# Patient Record
Sex: Male | Born: 1995 | Race: White | Hispanic: No | Marital: Single | Smoking: Former smoker
Health system: Southern US, Community
[De-identification: ages and names within clinical notes are randomized; demographics above are authoritative.]

## PROBLEM LIST (undated history)

## (undated) DIAGNOSIS — J45909 Unspecified asthma, uncomplicated: Secondary | ICD-10-CM

## (undated) DIAGNOSIS — F429 Obsessive-compulsive disorder, unspecified: Secondary | ICD-10-CM

## (undated) DIAGNOSIS — F909 Attention-deficit hyperactivity disorder, unspecified type: Secondary | ICD-10-CM

## (undated) DIAGNOSIS — R55 Syncope and collapse: Secondary | ICD-10-CM

## (undated) HISTORY — PX: APPENDECTOMY: SHX54

## (undated) HISTORY — PX: ABDOMINAL SURGERY: SHX537

---
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
## 2015-09-28 ENCOUNTER — Encounter (HOSPITAL_COMMUNITY): Payer: Self-pay | Admitting: Vascular Surgery

## 2015-09-28 ENCOUNTER — Emergency Department (HOSPITAL_COMMUNITY)
Admission: EM | Admit: 2015-09-28 | Discharge: 2015-09-28 | Disposition: A | Discharge status: Home / Self Care | Attending: Emergency Medicine | Admitting: Emergency Medicine

## 2015-09-28 DIAGNOSIS — Y998 Other external cause status: Secondary | ICD-10-CM | POA: Diagnosis not present

## 2015-09-28 DIAGNOSIS — Z9889 Other specified postprocedural states: Secondary | ICD-10-CM | POA: Insufficient documentation

## 2015-09-28 DIAGNOSIS — S3991XA Unspecified injury of abdomen, initial encounter: Secondary | ICD-10-CM | POA: Insufficient documentation

## 2015-09-28 DIAGNOSIS — Z8659 Personal history of other mental and behavioral disorders: Secondary | ICD-10-CM | POA: Diagnosis not present

## 2015-09-28 DIAGNOSIS — R1033 Periumbilical pain: Secondary | ICD-10-CM

## 2015-09-28 DIAGNOSIS — Y9389 Activity, other specified: Secondary | ICD-10-CM | POA: Diagnosis not present

## 2015-09-28 DIAGNOSIS — Z794 Long term (current) use of insulin: Secondary | ICD-10-CM | POA: Insufficient documentation

## 2015-09-28 DIAGNOSIS — Y9289 Other specified places as the place of occurrence of the external cause: Secondary | ICD-10-CM | POA: Insufficient documentation

## 2015-09-28 DIAGNOSIS — Z79899 Other long term (current) drug therapy: Secondary | ICD-10-CM | POA: Diagnosis not present

## 2015-09-28 DIAGNOSIS — J45909 Unspecified asthma, uncomplicated: Secondary | ICD-10-CM | POA: Insufficient documentation

## 2015-09-28 DIAGNOSIS — Z9049 Acquired absence of other specified parts of digestive tract: Secondary | ICD-10-CM | POA: Diagnosis not present

## 2015-09-28 DIAGNOSIS — F17210 Nicotine dependence, cigarettes, uncomplicated: Secondary | ICD-10-CM | POA: Diagnosis not present

## 2015-09-28 LAB — COMPREHENSIVE METABOLIC PANEL
ALBUMIN: 4.3 g/dL (ref 3.5–5.0)
ALT: 20 U/L (ref 17–63)
ANION GAP: 6 (ref 5–15)
AST: 26 U/L (ref 15–41)
Alkaline Phosphatase: 72 U/L (ref 38–126)
BILIRUBIN TOTAL: 0.8 mg/dL (ref 0.3–1.2)
BUN: 8 mg/dL (ref 6–20)
CO2: 28 mmol/L (ref 22–32)
Calcium: 9.6 mg/dL (ref 8.9–10.3)
Chloride: 106 mmol/L (ref 101–111)
Creatinine, Ser: 0.98 mg/dL (ref 0.61–1.24)
GFR calc Af Amer: >60 mL/min (ref >60)
GFR calc non Af Amer: >60 mL/min (ref >60)
GLUCOSE: 112 mg/dL — AB (ref 65–99)
POTASSIUM: 3.6 mmol/L (ref 3.5–5.1)
SODIUM: 140 mmol/L (ref 135–145)
TOTAL PROTEIN: 6.7 g/dL (ref 6.5–8.1)

## 2015-09-28 LAB — URINALYSIS, ROUTINE W REFLEX MICROSCOPIC
BILIRUBIN URINE: NEGATIVE
Glucose, UA: NEGATIVE mg/dL
Hgb urine dipstick: NEGATIVE
Ketones, ur: NEGATIVE mg/dL
Leukocytes, UA: NEGATIVE
NITRITE: NEGATIVE
PH: 7.5 (ref 5.0–8.0)
Protein, ur: NEGATIVE mg/dL
SPECIFIC GRAVITY, URINE: 1.022 (ref 1.005–1.030)

## 2015-09-28 LAB — CBC
HEMATOCRIT: 47.3 % (ref 39.0–52.0)
HEMOGLOBIN: 16.1 g/dL (ref 13.0–17.0)
MCH: 30.1 pg (ref 26.0–34.0)
MCHC: 34 g/dL (ref 30.0–36.0)
MCV: 88.4 fL (ref 78.0–100.0)
Platelets: 235 10*3/uL (ref 150–400)
RBC: 5.35 MIL/uL (ref 4.22–5.81)
RDW: 12.3 % (ref 11.5–15.5)
WBC: 5 10*3/uL (ref 4.0–10.5)

## 2015-09-28 MED ORDER — OXYCODONE HCL 5 MG PO TABS: 5.0000 mg | ORAL_TABLET | Freq: Four times a day (QID) | ORAL | Status: DC | PRN

## 2015-09-28 MED ORDER — OXYCODONE-ACETAMINOPHEN 5-325 MG PO TABS
1.0000 | ORAL_TABLET | Freq: Once | ORAL | Status: AC
  Administered 2015-09-28: 1 via ORAL
  Filled 2015-09-28: qty 1
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
## 2015-09-28 NOTE — ED Notes (Signed)
Pt is aware urine is needed, pt unable to urinate at this time.  

## 2015-09-28 NOTE — ED Notes (Signed)
PA at bedside.
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
## 2015-09-28 NOTE — Discharge Instructions (Signed)
Please read and follow all provided instructions.  Your diagnoses today include:  1. Periumbilical abdominal pain    Tests performed today include:  Blood counts and electrolytes  Blood tests to check liver and kidney function  Blood tests to check pancreas function  Urine test to look for infection and pregnancy (in women)  Vital signs. See below for your results today.   Medications prescribed:   Take any prescribed medications only as directed.  You can use Ibuprofen 400mg  combined with Tylenol 1000mg  for pain relief every 6 hours. Do not exceed 4g of Tylenol in one 24 hour period. Do not exceed 10 days of this treatment. Use narcotics if pain uncontrolled with the aforementioned regiment.   Home care instructions:   Follow any educational materials contained in this packet.  Follow-up instructions: Please follow-up with your primary care provider for further evaluation of your symptoms.    Return instructions:  SEEK IMMEDIATE MEDICAL ATTENTION IF:  The pain does not go away or becomes severe   A temperature above 101F develops   Repeated vomiting occurs (multiple episodes)   The pain becomes localized to portions of the abdomen. The right side could possibly be appendicitis. In an adult, the left lower portion of the abdomen could be colitis or diverticulitis.   Blood is being passed in stools or vomit (bright red or black tarry stools)   You develop chest pain, difficulty breathing, dizziness or fainting, or become confused, poorly responsive, or inconsolable (young children)  If you have any other emergent concerns regarding your health  Additional Information: Abdominal (belly) pain can be caused by many things. Your caregiver performed an examination and possibly ordered blood/urine tests and imaging (CT scan, x-rays, ultrasound). Many cases can be observed and treated at home after initial evaluation in the emergency department. Even though you are being  discharged home, abdominal pain can be unpredictable. Therefore, you need a repeated exam if your pain does not resolve, returns, or worsens. Most patients with abdominal pain don't have to be admitted to the hospital or have surgery, but serious problems like appendicitis and gallbladder attacks can start out as nonspecific pain. Many abdominal conditions cannot be diagnosed in one visit, so follow-up evaluations are very important.  Your vital signs today were: BP 135/64 mmHg   Pulse 65   Temp(Src) 98.4 F (36.9 C) (Oral)   Resp 20   SpO2 98% If your blood pressure (bp) was elevated above 135/85 this visit, please have this repeated by your doctor within one month. --------------

## 2015-12-09 IMAGING — CR DG CHEST 2V
2 series · 2 of 2 positions shown · non-contrast
Comparison: None.

CLINICAL DATA: Near syncope. Acute onset of left-sided chest pain.
Current history of moderate asthma. Initial encounter.

EXAM:
CHEST  2 VIEW

[w chest pa]
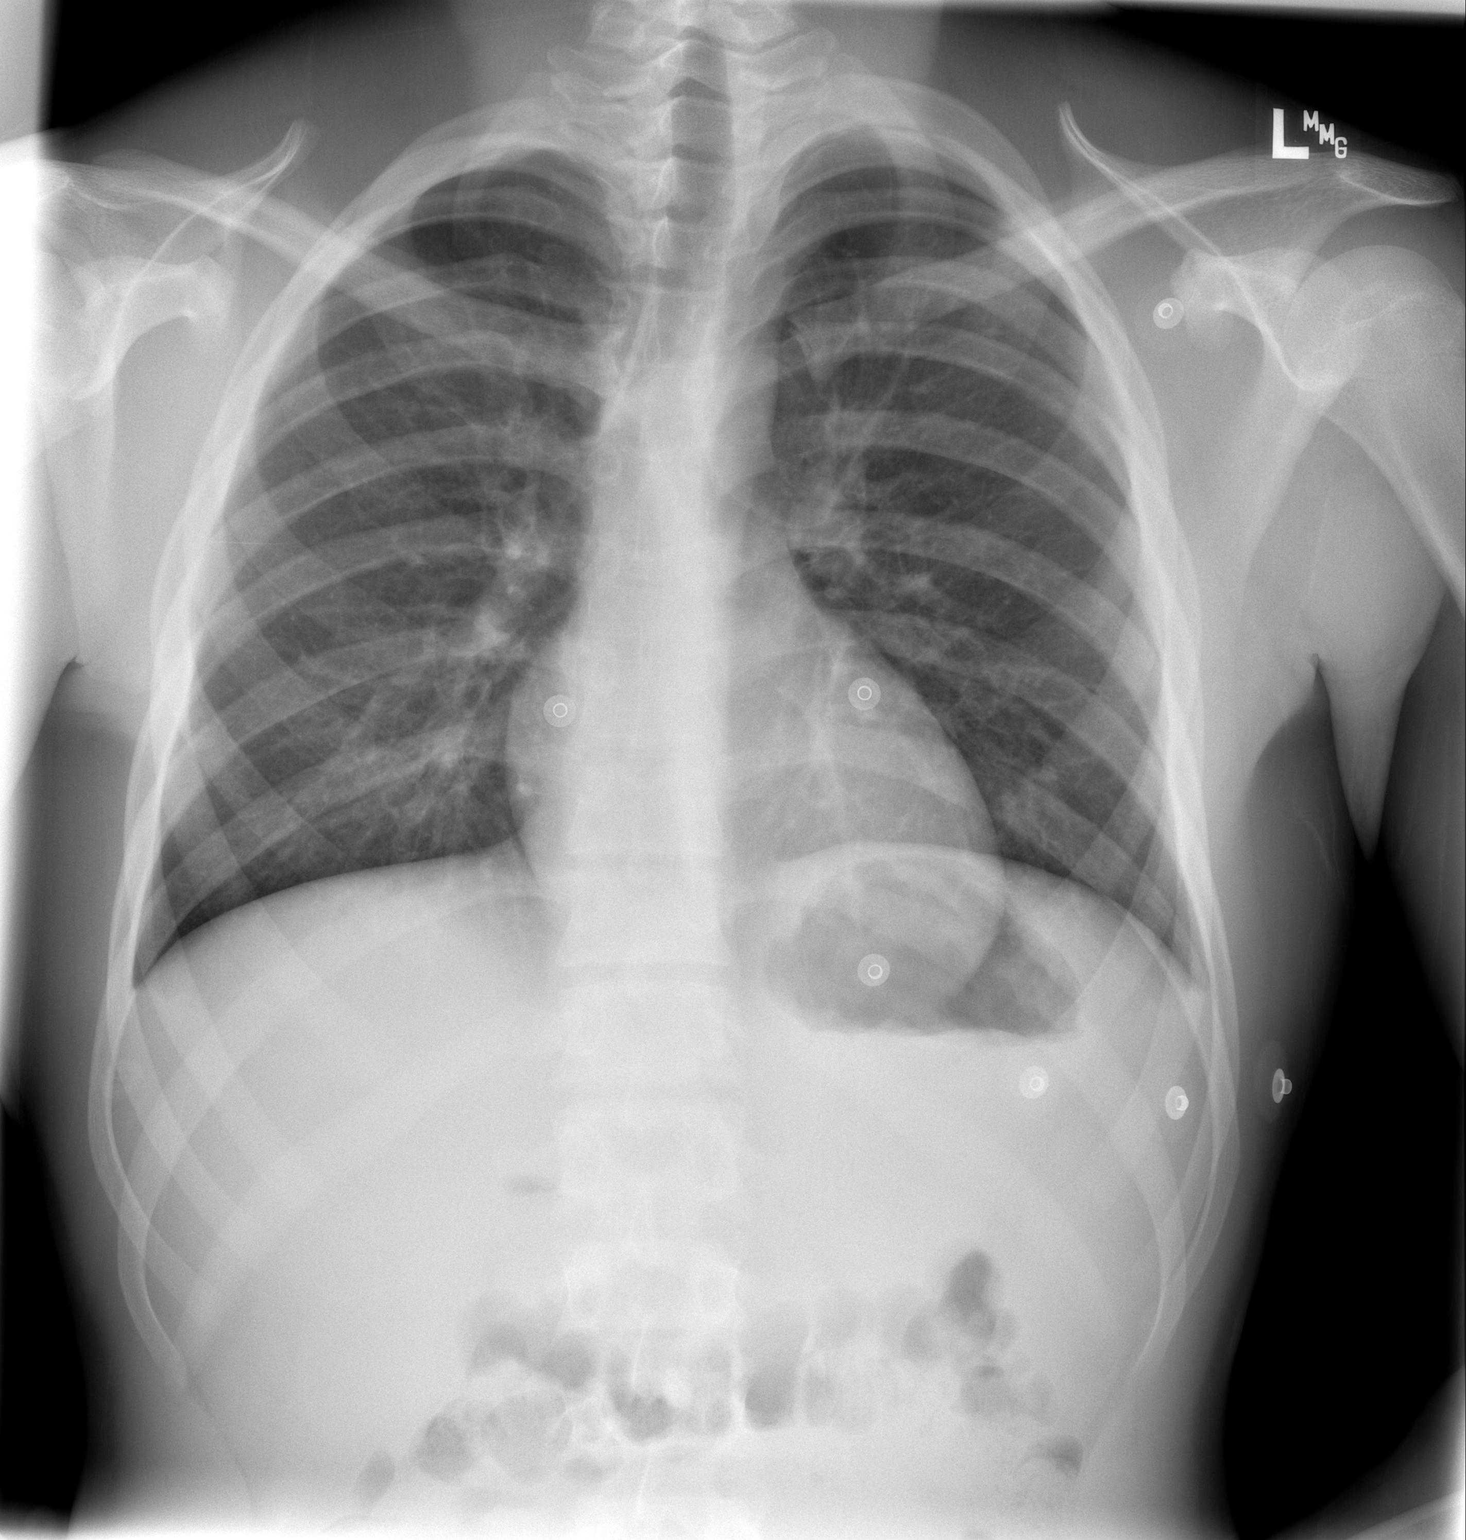

[w chest lat]
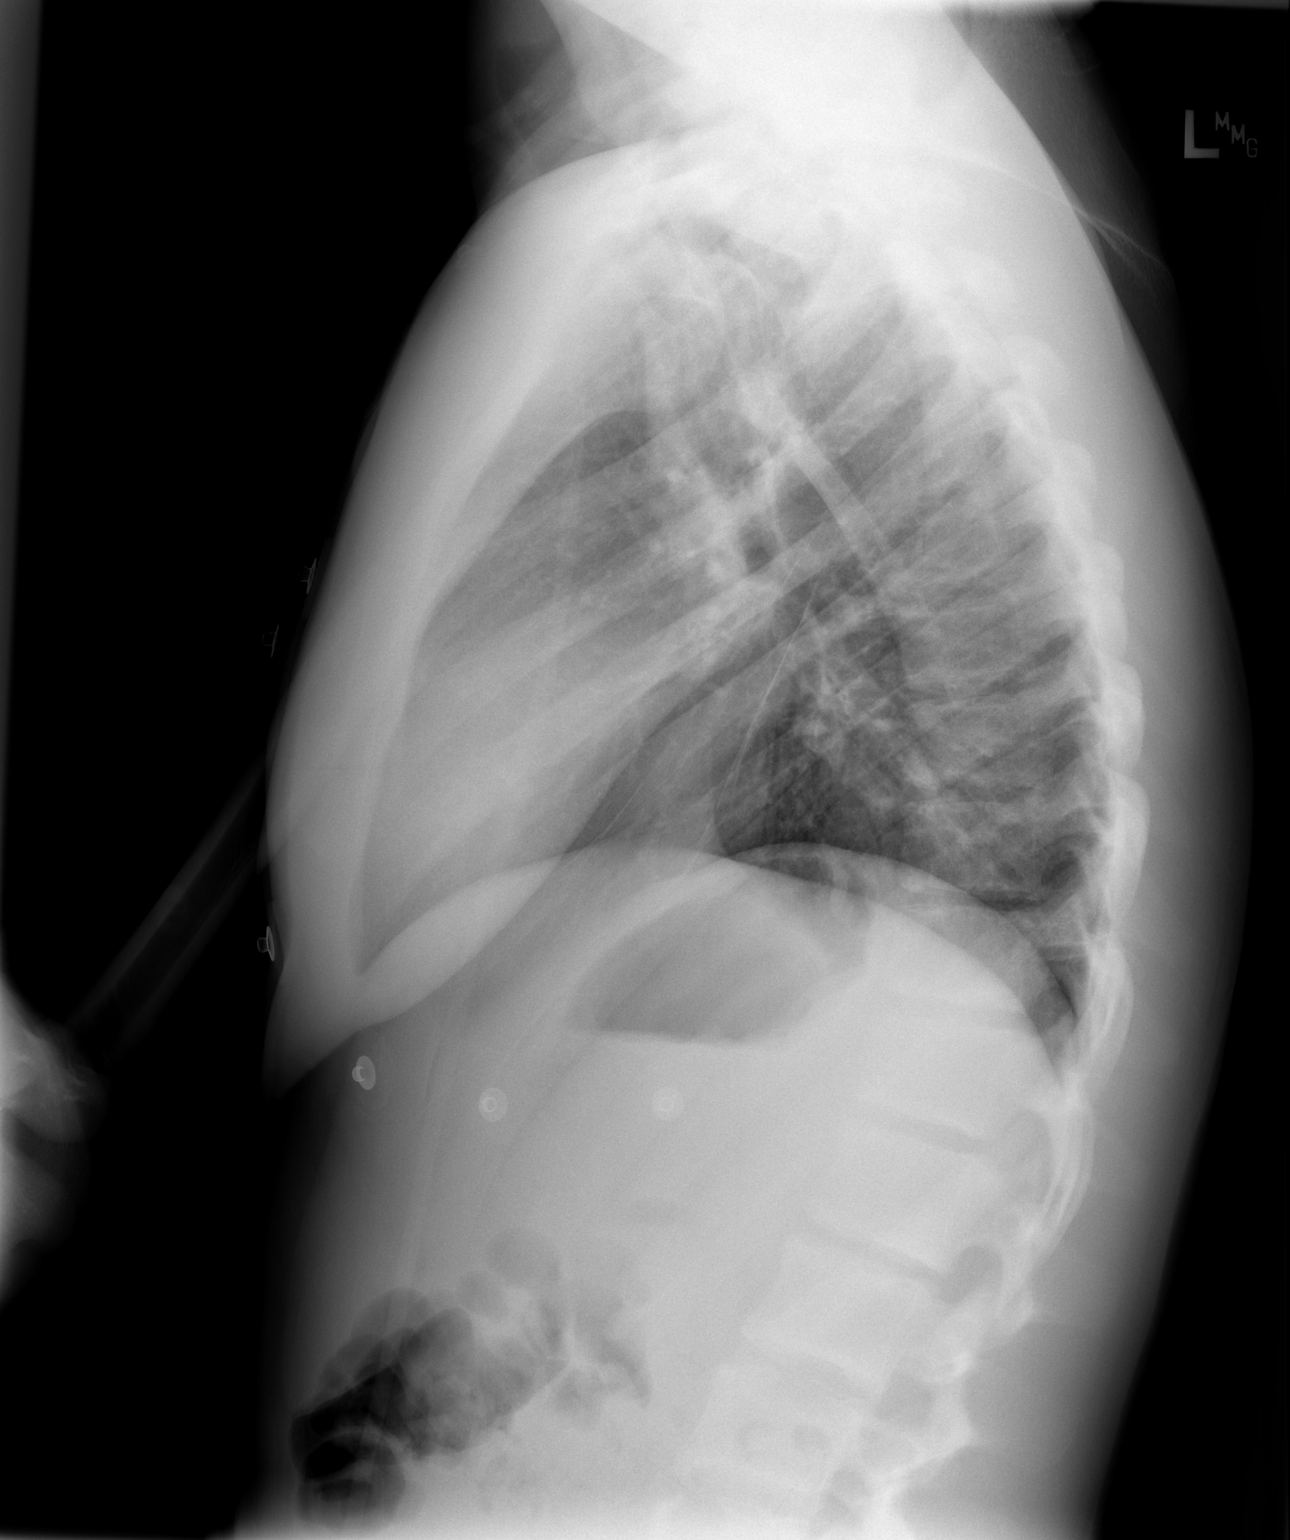

[2 of 2 positions shown; findings below may reference images not displayed]

FINDINGS: The lungs are well-aerated. There is no evidence of focal
opacification, pleural effusion or pneumothorax. Minimal right
basilar density may simply reflect normal vasculature, given the
lack of pulmonary symptoms.

The heart is normal in size; the mediastinal contour is within
normal limits. No acute osseous abnormalities are seen.
IMPRESSION: No acute cardiopulmonary process seen.

## 2018-09-16 ENCOUNTER — Encounter: Payer: Self-pay | Admitting: Emergency Medicine

## 2018-09-16 ENCOUNTER — Other Ambulatory Visit: Payer: Self-pay

## 2018-09-16 ENCOUNTER — Ambulatory Visit
Admission: EM | Admit: 2018-09-16 | Discharge: 2018-09-16 | Disposition: A | Discharge status: Home / Self Care | Payer: Managed Care, Other (non HMO) | Attending: Emergency Medicine | Admitting: Emergency Medicine

## 2018-09-16 DIAGNOSIS — H6122 Impacted cerumen, left ear: Secondary | ICD-10-CM

## 2018-09-16 DIAGNOSIS — H6692 Otitis media, unspecified, left ear: Secondary | ICD-10-CM

## 2018-09-16 MED ORDER — AMOXICILLIN 875 MG PO TABS: 875.0000 mg | ORAL_TABLET | Freq: Two times a day (BID) | ORAL | 0 refills | Status: AC

## 2018-09-16 NOTE — ED Provider Notes (Signed)
[facility] ____________________________________________  Time seen: Approximately 1:11 PM  I have reviewed the triage vital signs and the nursing notes.   HISTORY  Chief Complaint Otalgia (APPT)   HPI Douglas Hensley is a 23 y.o. male seen for evaluation of left ear discomfort and muffled sensation present since yesterday.  States his friend accidentally pushed a Q-tip in too far and he has had muffled hearing since.  Also left ear soreness.  Denies drainage.  Denies recent cough, congestion, sore throat, fevers or other complaints.  Continues to eat and drink well.  Denies other aggravating alleviating factors.  No recent antibiotic use.   Past Medical History:  Diagnosis Date  . ADHD (attention deficit hyperactivity disorder)   . Asthma   . OCD (obsessive compulsive disorder)   . Syncope     There are no active problems to display for this patient.   Past Surgical History:  Procedure Laterality Date  . ABDOMINAL SURGERY    . APPENDECTOMY       No current facility-administered medications for this encounter.   Current Outpatient Medications:  .  amoxicillin (AMOXIL) 875 MG tablet, Take 1 tablet (875 mg total) by mouth 2 (two) times daily., Disp: 20 tablet, Rfl: 0  Allergies Depakote [valproic acid] and Keflex [cephalexin]  History reviewed. No pertinent family history.  Social History Social History   Tobacco Use  . Smoking status: Former Smoker    Packs/day: 0.50    Types: Cigarettes  . Smokeless tobacco: Never Used  Substance Use Topics  . Alcohol use: No  . Drug use: No    Review of Systems Constitutional: No fever ENT: No sore throat.  Positive otalgia. Cardiovascular: Denies chest pain. Respiratory: Denies shortness of breath. Gastrointestinal: No abdominal pain.   Musculoskeletal: Negative for back pain. Skin: Negative for rash.   ____________________________________________   PHYSICAL EXAM:  VITAL SIGNS: ED Triage  Vitals  Enc Vitals Group     BP 09/16/18 1223 124/85     Pulse Rate 09/16/18 1223 85     Resp 09/16/18 1223 18     Temp 09/16/18 1223 98.4 F (36.9 C)     Temp Source 09/16/18 1223 Oral     SpO2 09/16/18 1223 100 %     Weight 09/16/18 1222 128 lb (58.1 kg)     Height 09/16/18 1222 5\' 9"  (1.753 m)     Head Circumference --      Peak Flow --      Pain Score 09/16/18 1222 6     Pain Loc --      Pain Edu? --      Excl. in GC? --     Constitutional: Alert and oriented. Well appearing and in no acute distress. Eyes: Conjunctivae are normal.  Head: Atraumatic. No sinus tenderness to palpation. No swelling. No erythema.  Ears: Left: Mild tenderness with auricle movement, canal with full cerumen impaction, post cerumen removal, canal clear, moderate erythema with dull TM, TM intact.  Right: Nontender, normal canal, no erythema, normal TM.  Nose: No nasal congestion.  Mouth/Throat: Mucous membranes are moist. No pharyngeal erythema. No tonsillar swelling or exudate.  Neck: No stridor.  No cervical spine tenderness to palpation. Hematological/Lymphatic/Immunilogical: No cervical lymphadenopathy. Cardiovascular: Normal rate, regular rhythm. Grossly normal heart sounds.  Good peripheral circulation. Respiratory: Normal respiratory effort.  No retractions. No wheezes, rales or rhonchi. Good air movement.  Musculoskeletal: Ambulatory with steady gait.  Neurologic:  Normal speech and language. No gait instability.  Skin:  Skin appears warm, dry and intact. No rash noted. Psychiatric: Mood and affect are normal. Speech and behavior are normal. ___________________________________________   LABS (all labs ordered are listed, but only abnormal results are displayed)  Labs Reviewed - No data to display ____________________________________________   PROCEDURES Procedures   Left ear cerumen total impaction.  Irrigated removed by [employer].  Patient tolerated well.  INITIAL IMPRESSION / ASSESSMENT  AND PLAN / ED COURSE  Pertinent labs & imaging results that were available during my care of the patient were reviewed by me and considered in my medical decision making (see chart for details).  Well-appearing patient.  No acute distress.  Left total cerumen impaction, removed.  Left otitis media.  Will treat with oral amoxicillin.  Supportive care.  Discussed follow up with Primary care physician this week. Discussed follow up and return parameters including no resolution or any worsening concerns. Patient verbalized understanding and agreed to plan.   ____________________________________________   FINAL CLINICAL IMPRESSION(S) / ED DIAGNOSES  Final diagnoses:  Impacted cerumen of left ear  Left otitis media, unspecified otitis media type     ED Discharge Orders         Ordered    amoxicillin (AMOXIL) 875 MG tablet  2 times daily     09/16/18 1319           Note: This dictation was prepared with Dragon dictation along with smaller phrase technology. Any transcriptional errors that result from this process are unintentional.         Renford DillsMiller, Galilea Quito, NP 09/16/18 1703

## 2018-09-16 NOTE — Discharge Instructions (Addendum)
Take medication as prescribed. Rest. Drink plenty of fluids.  ° °Follow up with your primary care physician this week as needed. Return to Urgent care for new or worsening concerns.  ° °

## 2018-09-16 NOTE — ED Triage Notes (Signed)
Patient states he used a q-tip yesterday and now has pain and fullness in his left ear.

## 2019-05-19 ENCOUNTER — Ambulatory Visit: Payer: Managed Care, Other (non HMO) | Attending: Internal Medicine

## 2019-05-19 DIAGNOSIS — Z20822 Contact with and (suspected) exposure to covid-19: Secondary | ICD-10-CM

## 2019-05-20 LAB — NOVEL CORONAVIRUS, NAA: SARS-CoV-2, NAA: NOT DETECTED

## 2021-08-10 ENCOUNTER — Ambulatory Visit: Payer: Self-pay | Admitting: General Surgery

## 2021-08-10 NOTE — H&P (View-Only) (Signed)
PATIENT PROFILE: ?Douglas Hensley is a 26 y.o. male who presents to the Clinic for consultation at the request of Whitaker, PA for evaluation of recurrent infection of the umbilical area. ? ?PCP:  Whitaker, Jason Hestle, PA ? ?HISTORY OF PRESENT ILLNESS: ?Douglas Hensley reports He has recurrent infection of the periumbilical area.  This infection happened about every month or 2.  He endorses that it happened mostly to the right side of the umbilical area.  He feels pain on the periumbilical area during episodes.  No pain radiation.  The area gets red and drains purulent output.  He also takes a bunch of hair from the area.  He denies episode of fever.  He has had multiple incision and drainage.  Currently he denies any flareup. ? ? ?PROBLEM LIST: ?Problem List  Date Reviewed: 03/29/2021  ? ?       Noted  ? Syncope 12/02/2012  ? Palpitations 02/26/2012  ? Tourette syndrome 01/22/2012  ? HTN (hypertension) 01/22/2012  ? ? ?GENERAL REVIEW OF SYSTEMS:  ? ?General ROS: negative for - chills, fatigue, fever, weight gain or weight loss ?Allergy and Immunology ROS: negative for - hives  ?Hematological and Lymphatic ROS: negative for - bleeding problems or bruising, negative for palpable nodes ?Endocrine ROS: negative for - heat or cold intolerance, hair changes ?Respiratory ROS: negative for - cough, shortness of breath or wheezing ?Cardiovascular ROS: no chest pain or palpitations ?GI ROS: negative for nausea, vomiting, abdominal pain, diarrhea, constipation ?Musculoskeletal ROS: negative for - joint swelling or muscle pain ?Neurological ROS: negative for - confusion, syncope ?Dermatological ROS: negative for pruritus and rash ?Psychiatric: negative for anxiety, depression, difficulty sleeping and memory loss ? ?MEDICATIONS: ?Current Outpatient Medications  ?Medication Sig Dispense Refill  ? diphenhydrAMINE-acetaminophen (TYLENOL PM EXTRA STRENGTH) 25-500 mg per tablet Take 1 tablet by mouth once daily    ? traZODone (DESYREL)  50 MG tablet Take 1 tablet (50 mg total) by mouth at bedtime 30 tablet 11  ? ?No current facility-administered medications for this visit.  ? ? ?ALLERGIES: ?Divalproex, Cephalexin, Penicillin, and Penicillins ? ?PAST MEDICAL HISTORY: ?Past Medical History:  ?Diagnosis Date  ? Allergy   ? Tourette syndrome   ? ? ?PAST SURGICAL HISTORY: ?Past Surgical History:  ?Procedure Laterality Date  ? ADENOIDECTOMY    ?  ? ?FAMILY HISTORY: ?Family History  ?Problem Relation Age of Onset  ? High blood pressure (Hypertension) Mother   ? Allergies Mother   ? Cancer Mother   ? Diabetes Mother   ? High blood pressure (Hypertension) Father   ? Allergies Father   ? Diabetes Father   ? Thyroid disease Father   ? Spina bifida Sister   ? Cancer Maternal Grandmother   ? Cancer Maternal Grandfather   ? Heart disease Paternal Grandmother   ? Hearing loss Paternal Grandmother   ? Thyroid disease Paternal Grandmother   ? Hearing loss Paternal Grandfather   ? Heart disease Paternal Grandfather   ?  ? ?SOCIAL HISTORY: ?Social History  ? ?Socioeconomic History  ? Marital status: Single  ?Tobacco Use  ? Smoking status: Former  ?  Packs/day: 2.00  ?  Years: 3.00  ?  Pack years: 6.00  ?  Types: Cigarettes  ? Smokeless tobacco: Current  ?  Types: Chew  ?Vaping Use  ? Vaping Use: Some days  ?Substance and Sexual Activity  ? Alcohol use: Yes  ?  Alcohol/week: 12.0 standard drinks  ?  Types: 12 Cans of beer per   week  ? Drug use: Yes  ?  Comment: marajuana  ? Sexual activity: Yes  ?  Partners: Female  ?Social History Narrative  ? Rising 11th grader at [employer]. Lives in [place] with parents and twin sister.  ? ? ?PHYSICAL EXAM: ?Vitals:  ? 08/10/21 0938  ?BP: 130/87  ?Pulse: 62  ? ?Body mass index is 24.51 kg/m?. ?Weight: 75.3 kg (166 lb)  ? ?GENERAL: Alert, active, oriented x3 ? ?HEENT: Pupils equal reactive to light. Extraocular movements are intact. Sclera clear. Palpebral conjunctiva normal red color.Pharynx clear. ? ?NECK: Supple with  no palpable mass and no adenopathy. ? ?LUNGS: Sound clear with no rales rhonchi or wheezes. ? ?HEART: Regular rhythm S1 and S2 without murmur. ? ?ABDOMEN: Soft and depressible, nontender with no palpable mass, no hepatomegaly. Wounds dry and clean.  Today periumbilical area with small scarred sinus without any deep fluid collection.  Area is hairy.  Infraumbilical scar ? ?EXTREMITIES: Well-developed well-nourished symmetrical with no dependent edema. ? ?NEUROLOGICAL: Awake alert oriented, facial expression symmetrical, moving all extremities. ? ?REVIEW OF DATA: ?I have reviewed the following data today: ?Initial consult on 07/27/2021  ?Component Date Value  ? WBC (White Blood Cell Co* 07/27/2021 8.8   ? RBC (Red Blood Cell Coun* 07/27/2021 5.09   ? Hemoglobin 07/27/2021 15.5   ? Hematocrit 07/27/2021 44.4   ? MCV (Mean Corpuscular Vo* 07/27/2021 87.2   ? MCH (Mean Corpuscular He* 07/27/2021 30.5   ? MCHC (Mean Corpuscular H* 07/27/2021 34.9   ? Platelet Count 07/27/2021 234   ? RDW-CV (Red Cell Distrib* 07/27/2021 12.0   ? MPV (Mean Platelet Volum* 07/27/2021 10.3   ? Neutrophils 07/27/2021 6.72   ? Lymphocytes 07/27/2021 1.16   ? Monocytes 07/27/2021 0.49   ? Eosinophils 07/27/2021 0.39   ? Basophils 07/27/2021 0.07   ? Neutrophil % 07/27/2021 76.1 (H)   ? Lymphocyte % 07/27/2021 13.1   ? Monocyte % 07/27/2021 5.5   ? Eosinophil % 07/27/2021 4.4   ? Basophil% 07/27/2021 0.8   ? Immature Granulocyte % 07/27/2021 0.1   ? Immature Granulocyte Cou* 07/27/2021 0.01   ? Glucose 07/27/2021 80   ? Sodium 07/27/2021 140   ? Potassium 07/27/2021 4.2   ? Chloride 07/27/2021 105   ? Carbon Dioxide (CO2) 07/27/2021 29.3   ? Urea Nitrogen (BUN) 07/27/2021 16   ? Creatinine 07/27/2021 0.9   ? Glomerular Filtration Ra* 07/27/2021 102   ? Calcium 07/27/2021 9.6   ? AST  07/27/2021 31   ? ALT  07/27/2021 20   ? Alk Phos (alkaline Phosp* 07/27/2021 77   ? Albumin 07/27/2021 4.6   ? Bilirubin, Total 07/27/2021 0.9   ? Protein, Total  07/27/2021 6.8   ? A/G Ratio 07/27/2021 2.1   ? Cholesterol, Total 07/27/2021 123   ? Triglyceride 07/27/2021 28 (L)   ? HDL (High Density Lipopr* 07/27/2021 43.4   ? LDL Calculated 07/27/2021 74   ? VLDL Cholesterol 07/27/2021 6   ? Cholesterol/HDL Ratio 07/27/2021 2.8   ? Thyroid Stimulating Horm* 07/27/2021 0.928   ? Color 07/27/2021 Yellow   ? Clarity 07/27/2021 Clear   ? Specific Gravity 07/27/2021 1.025   ? pH, Urine 07/27/2021 6.0   ? Protein, Urinalysis 07/27/2021 Negative   ? Glucose, Urinalysis 07/27/2021 Negative   ? Ketones, Urinalysis 07/27/2021 Negative   ? Blood, Urinalysis 07/27/2021 Negative   ? Nitrite, Urinalysis 07/27/2021 Negative   ? Leukocyte Esterase, Urin* 07/27/2021 Negative   ?   White Blood Cells, Urina* 07/27/2021 None Seen   ? Red Blood Cells, Urinaly* 07/27/2021 None Seen   ? Bacteria, Urinalysis 07/27/2021 None Seen   ? Squamous Epithelial Cell* 07/27/2021 None Seen   ?  ? ?ASSESSMENT: ?Douglas Hensley is a 26 y.o. male presenting for consultation for periumbilical infections. ? ?The patient endorses that he has recurrent infections every month or 2.  Infections are on the paramedical area mostly on the right side.  He endorses that the content of the drainage is purulent and a bunch of hair.  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  We discussed about surgical excision of the pilonidal disease.  We discussed about possible excision of the umbilical stalk.  We discussed about risk of surgery drain includes pain, infection, bleeding, recurrence, scarring, hernia, risk for anesthesia among others.  The patient report he understood and agreed to proceed. ? ?Pilonidal cyst without abscess [L05.91] ? ?PLAN: ?1.  Excision of the umbilical pilonidal cyst (11771) ?2.  Avoid taking aspirin 5 days before the procedure ?3.  CBC and CMP done ?4.  Contact us if you have any concern ? ?Patient verbalized understanding, all questions were answered, and  were agreeable with the plan outlined above.  ? ? ? ?Edgardo Cintron-Diaz, MD ? ?Electronically signed by Edgardo Cintron-Diaz, MD ? ?

## 2021-08-10 NOTE — H&P (Signed)
PATIENT PROFILE: ?Douglas Hensley is a 26 y.o. male who presents to the Clinic for consultation at the request of Douglas Hensley, Douglas Hensley for evaluation of recurrent infection of the umbilical area. ? ?PCP:  Douglas Rossetti, PA ? ?HISTORY OF PRESENT ILLNESS: ?Douglas Hensley reports He has recurrent infection of the periumbilical area.  This infection happened about every month or 2.  He endorses that it happened mostly to the right side of the umbilical area.  He feels pain on the periumbilical area during episodes.  No pain radiation.  The area gets red and drains purulent output.  He also takes a bunch of hair from the area.  He denies episode of fever.  He has had multiple incision and drainage.  Currently he denies any flareup. ? ? ?PROBLEM LIST: ?Problem List  Date Reviewed: 03/29/2021  ? ?       Noted  ? Syncope 12/02/2012  ? Palpitations 02/26/2012  ? Tourette syndrome 01/22/2012  ? HTN (hypertension) 01/22/2012  ? ? ?GENERAL REVIEW OF SYSTEMS:  ? ?General ROS: negative for - chills, fatigue, fever, weight gain or weight loss ?Allergy and Immunology ROS: negative for - hives  ?Hematological and Lymphatic ROS: negative for - bleeding problems or bruising, negative for palpable nodes ?Endocrine ROS: negative for - heat or cold intolerance, hair changes ?Respiratory ROS: negative for - cough, shortness of breath or wheezing ?Cardiovascular ROS: no chest pain or palpitations ?GI ROS: negative for nausea, vomiting, abdominal pain, diarrhea, constipation ?Musculoskeletal ROS: negative for - joint swelling or muscle pain ?Neurological ROS: negative for - confusion, syncope ?Dermatological ROS: negative for pruritus and rash ?Psychiatric: negative for anxiety, depression, difficulty sleeping and memory loss ? ?MEDICATIONS: ?Current Outpatient Medications  ?Medication Sig Dispense Refill  ? diphenhydrAMINE-acetaminophen (TYLENOL PM EXTRA STRENGTH) 25-500 mg per tablet Take 1 tablet by mouth once daily    ? traZODone (DESYREL)  50 MG tablet Take 1 tablet (50 mg total) by mouth at bedtime 30 tablet 11  ? ?No current facility-administered medications for this visit.  ? ? ?ALLERGIES: ?Divalproex, Cephalexin, Penicillin, and Penicillins ? ?PAST MEDICAL HISTORY: ?Past Medical History:  ?Diagnosis Date  ? Allergy   ? Tourette syndrome   ? ? ?PAST SURGICAL HISTORY: ?Past Surgical History:  ?Procedure Laterality Date  ? ADENOIDECTOMY    ?  ? ?FAMILY HISTORY: ?Family History  ?Problem Relation Age of Onset  ? High blood pressure (Hypertension) Mother   ? Allergies Mother   ? Cancer Mother   ? Diabetes Mother   ? High blood pressure (Hypertension) Father   ? Allergies Father   ? Diabetes Father   ? Thyroid disease Father   ? Spina bifida Sister   ? Cancer Maternal Grandmother   ? Cancer Maternal Grandfather   ? Heart disease Paternal Grandmother   ? Hearing loss Paternal Grandmother   ? Thyroid disease Paternal Grandmother   ? Hearing loss Paternal Grandfather   ? Heart disease Paternal Grandfather   ?  ? ?SOCIAL HISTORY: ?Social History  ? ?Socioeconomic History  ? Marital status: Single  ?Tobacco Use  ? Smoking status: Former  ?  Packs/day: 2.00  ?  Years: 3.00  ?  Pack years: 6.00  ?  Types: Cigarettes  ? Smokeless tobacco: Current  ?  Types: Chew  ?Vaping Use  ? Vaping Use: Some days  ?Substance and Sexual Activity  ? Alcohol use: Yes  ?  Alcohol/week: 12.0 standard drinks  ?  Types: 12 Cans of beer per  week  ? Drug use: Yes  ?  Comment: marajuana  ? Sexual activity: Yes  ?  Partners: Female  ?Social History Narrative  ? Rising 11th grader at [employer]. Lives in [place] with parents and twin sister.  ? ? ?PHYSICAL EXAM: ?Vitals:  ? 08/10/21 0938  ?BP: 130/87  ?Pulse: 62  ? ?Body mass index is 24.51 kg/m?. ?Weight: 75.3 kg (166 lb)  ? ?GENERAL: Alert, active, oriented x3 ? ?HEENT: Pupils equal reactive to light. Extraocular movements are intact. Sclera clear. Palpebral conjunctiva normal red color.Pharynx clear. ? ?NECK: Supple with  no palpable mass and no adenopathy. ? ?LUNGS: Sound clear with no rales rhonchi or wheezes. ? ?HEART: Regular rhythm S1 and S2 without murmur. ? ?ABDOMEN: Soft and depressible, nontender with no palpable mass, no hepatomegaly. Wounds dry and clean.  Today periumbilical area with small scarred sinus without any deep fluid collection.  Area is hairy.  Infraumbilical scar ? ?EXTREMITIES: Well-developed well-nourished symmetrical with no dependent edema. ? ?NEUROLOGICAL: Awake alert oriented, facial expression symmetrical, moving all extremities. ? ?REVIEW OF DATA: ?I have reviewed the following data today: ?Initial consult on 07/27/2021  ?Component Date Value  ? WBC (White Blood Cell Co* 07/27/2021 8.8   ? RBC (Red Blood Cell Coun* 07/27/2021 5.09   ? Hemoglobin 07/27/2021 15.5   ? Hematocrit 07/27/2021 44.4   ? MCV (Mean Corpuscular Vo* 07/27/2021 87.2   ? MCH (Mean Corpuscular He* 07/27/2021 30.5   ? MCHC (Mean Corpuscular H* 07/27/2021 34.9   ? Platelet Count 07/27/2021 234   ? RDW-CV (Red Cell Distrib* 07/27/2021 12.0   ? MPV (Mean Platelet Volum* 07/27/2021 10.3   ? Neutrophils 07/27/2021 6.72   ? Lymphocytes 07/27/2021 1.16   ? Monocytes 07/27/2021 0.49   ? Eosinophils 07/27/2021 0.39   ? Basophils 07/27/2021 0.07   ? Neutrophil % 07/27/2021 76.1 (H)   ? Lymphocyte % 07/27/2021 13.1   ? Monocyte % 07/27/2021 5.5   ? Eosinophil % 07/27/2021 4.4   ? Basophil% 07/27/2021 0.8   ? Immature Granulocyte % 07/27/2021 0.1   ? Immature Granulocyte Cou* 07/27/2021 0.01   ? Glucose 07/27/2021 80   ? Sodium 07/27/2021 140   ? Potassium 07/27/2021 4.2   ? Chloride 07/27/2021 105   ? Carbon Dioxide (CO2) 07/27/2021 29.3   ? Urea Nitrogen (BUN) 07/27/2021 16   ? Creatinine 07/27/2021 0.9   ? Glomerular Filtration Ra* 07/27/2021 102   ? Calcium 07/27/2021 9.6   ? AST  07/27/2021 31   ? ALT  07/27/2021 20   ? Alk Phos (alkaline Phosp* 07/27/2021 77   ? Albumin 07/27/2021 4.6   ? Bilirubin, Total 07/27/2021 0.9   ? Protein, Total  07/27/2021 6.8   ? A/G Ratio 07/27/2021 2.1   ? Cholesterol, Total 07/27/2021 123   ? Triglyceride 07/27/2021 28 (L)   ? HDL (High Density Lipopr* 07/27/2021 43.4   ? LDL Calculated 07/27/2021 74   ? VLDL Cholesterol 07/27/2021 6   ? Cholesterol/HDL Ratio 07/27/2021 2.8   ? Thyroid Stimulating Horm* 07/27/2021 0.928   ? Color 07/27/2021 Yellow   ? Clarity 07/27/2021 Clear   ? Specific Gravity 07/27/2021 1.025   ? pH, Urine 07/27/2021 6.0   ? Protein, Urinalysis 07/27/2021 Negative   ? Glucose, Urinalysis 07/27/2021 Negative   ? Ketones, Urinalysis 07/27/2021 Negative   ? Blood, Urinalysis 07/27/2021 Negative   ? Nitrite, Urinalysis 07/27/2021 Negative   ? Leukocyte Esterase, Urin* 07/27/2021 Negative   ?  White Blood Cells, Urina* 07/27/2021 None Seen   ? Red Blood Cells, Urinaly* 07/27/2021 None Seen   ? Bacteria, Urinalysis 07/27/2021 None Seen   ? Squamous Epithelial Cell* 07/27/2021 None Seen   ?  ? ?ASSESSMENT: ?Mr. Guercio is a 26 y.o. male presenting for consultation for periumbilical infections. ? ?The patient endorses that he has recurrent infections every month or 2.  Infections are on the paramedical area mostly on the right side.  He endorses that the content of the drainage is purulent and a bunch of hair.  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  We discussed about surgical excision of the pilonidal disease.  We discussed about possible excision of the umbilical stalk.  We discussed about risk of surgery drain includes pain, infection, bleeding, recurrence, scarring, hernia, risk for anesthesia among others.  The patient report he understood and agreed to proceed. ? ?Pilonidal cyst without abscess [L05.91] ? ?PLAN: ?1.  Excision of the umbilical pilonidal cyst (74451) ?2.  Avoid taking aspirin 5 days before the procedure ?3.  CBC and CMP done ?4.  Contact us if you have any concern ? ?Patient verbalized understanding, all questions were answered, and  were agreeable with the plan outlined above.  ? ? ? ?Herbert Pun, MD ? ?Electronically signed by Herbert Pun, MD ? ?

## 2021-08-14 ENCOUNTER — Encounter
Admission: RE | Admit: 2021-08-14 | Discharge: 2021-08-14 | Disposition: A | Discharge status: Home / Self Care | Payer: Managed Care, Other (non HMO) | Source: Ambulatory Visit | Attending: General Surgery | Admitting: General Surgery

## 2021-08-14 ENCOUNTER — Other Ambulatory Visit: Payer: Self-pay

## 2021-08-14 NOTE — Patient Instructions (Addendum)
Your procedure is scheduled on:08-18-21 Friday ?Report to the Registration Desk on the 1st floor of the [facility].Then proceed to the 2nd floor Surgery Desk in the [facility] ?To find out your arrival time, please call [phone] between 1PM - 3PM on:08-17-21 Thursday ? ?REMEMBER: ?Instructions that are not followed completely may result in serious medical risk, up to and including death; or upon the discretion of your surgeon and anesthesiologist your surgery may need to be rescheduled. ? ?Do not eat food OR drink any liquids after midnight the night before surgery.  ?No gum chewing, lozengers or hard candies ? ?Do NOT take any medication the day of surgery ? ?One week prior to surgery: ?Stop Anti-inflammatories (NSAIDS) such as Advil, Aleve, Ibuprofen, Motrin, Naproxen, Naprosyn and Aspirin based products such as Excedrin, Goodys Powder, BC Powder.You may however, take Tylenol if needed for pain up until the day of surgery. ? ?Stop ANY OVER THE COUNTER supplements/vitamins NOW (08-14-21) until after surgery. ? ?No Alcohol for 24 hours before or after surgery. ? ?No Smoking including e-cigarettes for 24 hours prior to surgery.  ?No chewable tobacco products for at least 6 hours prior to surgery.  ?No nicotine patches on the day of surgery. ? ?Do not use any "recreational" drugs for at least a week prior to your surgery.  ?Please be advised that the combination of cocaine and anesthesia may have negative outcomes, up to and including death. ?If you test positive for cocaine, your surgery will be cancelled. ? ?On the morning of surgery brush your teeth with toothpaste and water, you may rinse your mouth with mouthwash if you wish. ?Do not swallow any toothpaste or mouthwash. ? ?Use CHG Soap as directed on instruction sheet. ? ?Do not wear jewelry, make-up, hairpins, clips or nail polish. ? ?Do not wear lotions, powders, or perfumes.  ? ?Do not shave body from the neck down 48 hours prior to surgery just in case  you cut yourself which could leave a site for infection.  ?Also, freshly shaved skin may become irritated if using the CHG soap. ? ?Contact lenses, hearing aids and dentures may not be worn into surgery. ? ?Do not bring valuables to the hospital. [facility] is not responsible for any missing/lost belongings or valuables.  ? ?Notify your doctor if there is any change in your medical condition (cold, fever, infection). ? ?Wear comfortable clothing (specific to your surgery type) to the hospital. ? ?After surgery, you can help prevent lung complications by doing breathing exercises.  ?Take deep breaths and cough every 1-2 hours. Your doctor may order a device called an Incentive Spirometer to help you take deep breaths. ?When coughing or sneezing, hold a pillow firmly against your incision with both hands. This is called ?splinting.? Doing this helps protect your incision. It also decreases belly discomfort. ? ?If you are being admitted to the hospital overnight, leave your suitcase in the car. ?After surgery it may be brought to your room. ? ?If you are being discharged the day of surgery, you will not be allowed to drive home. ?You will need a responsible adult (18 years or older) to drive you home and stay with you that night.  ? ?If you are taking public transportation, you will need to have a responsible adult (18 years or older) with you. ?Please confirm with your physician that it is acceptable to use public transportation.  ? ?Please call the [facility]. at [phone] if you have any questions  about these instructions. ? ?Surgery Visitation Policy: ? ?Patients undergoing a surgery or procedure may have two family members or support persons with them as long as the person is not COVID-19 positive or experiencing its symptoms.  ?  ?

## 2021-08-14 NOTE — Progress Notes (Signed)
?  Perioperative Services ?Pre-Admission/Anesthesia Testing ?  ?Date: 08/14/21 ?Name: Douglas Hensley ?MRN:   [id number] ? ?Re: Consideration of preoperative prophylactic antibiotic change  ? ?Request sent to: Herbert Pun, MD (routed and/or faxed via [facility]) ? ?Planned Surgical Procedure(s):  ? ? Case: [id number] Date/Time: 08/18/21 0715  ? Procedure: CYST EXCISION PILONIDAL SIMPLE  umbilical area (Abdomen)  ? Anesthesia type: General  ? Pre-op diagnosis: L05.91 Pilonidal cyst w/o abscess  ? Location: [facility] OR ROOM 03 / [facility] ORS FOR ANESTHESIA GROUP  ? Surgeons: Herbert Pun, MD  ? ?Clinical Notes:  ?Patient has a documented allergy to CEPHALEXIN  ?Advising that PCN has caused him to experience low severity urticarial rash in the past.  ? ?EMR review indicated that patient received cephalosporin in the past as follows: ?CEFTRIAXONE received on 06/16/2018 with no documented ADRs.  ? ?Screened as appropriate for cephalosporin use during medication reconciliation ?No immediate angioedema, dysphagia, SOB, anaphylaxis symptoms. ?No severe rash involving mucous membranes or skin necrosis. ?No hospital admissions related to side effects of PCN/cephalosporin use.  ?No documented reaction to PCN in the last 10 years. ? ?Request:  ?As an evidence based approach to reducing the rate of incidence for post-operative SSI and the development of MDROs, could an agent that allows for narrower antimicrobial coverage for preoperative prophylaxis in this patient's upcoming surgical course be considered?  ? ?Currently ordered preoperative prophylactic ABX: ciprofloxacin.  ? ?Specifically requesting change to cephalosporin (CEFAZOLIN).  ?Drug of choice for many procedures; it is the most widely studied antimicrobial agent with proven efficacy for antimicrobial prophylaxis.  ? ?Desirable duration of action, spectrum of activity against organisms commonly encountered in surgery, and it has an excellent safety profile and low  cost.  ? ?Active against streptococci, methicillin-susceptible staphylococci, and many gram-negative organisms. ? ?Please communicate decision with me and I will change the orders in Epic as per your direction.  ? ? ?Honor Loh, MSN, APRN, FNP-C, CEN ?[place]  ?Peri-operative Services Nurse Practitioner ?FAX: [phone] ?08/14/21 5:30 PM ?

## 2021-08-16 NOTE — Progress Notes (Signed)
?  Perioperative Services ?Pre-Admission/Anesthesia Testing ? ?  ? ?Date: 08/16/21 ? ?Name: DAWAN SCHWENKE ?MRN:   [id number] ? ?Re: Change in ABX for upcoming surgery ? ? Case: [id number] Date/Time: 08/18/21 0715  ? Procedure: CYST EXCISION PILONIDAL SIMPLE  umbilical area (Abdomen) - umbilical pilonidal --- please place in supine position  ? Anesthesia type: General  ? Pre-op diagnosis: L05.91 Pilonidal cyst w/o abscess  ? Location: [facility] OR ROOM 03 / [facility] ORS FOR ANESTHESIA GROUP  ? Surgeons: Herbert Pun, MD  ? ?Primary attending surgeon was consulted regarding consideration of therapeutic change in antimicrobial agent being used for preoperative prophylaxis in this patient's upcoming surgical case. Following analysis of the risk versus benefits, the patient's primary attending surgeon advised that it would be acceptable to discontinue the ordered ciprofloxacin and place an order for cefazolin 2 gm IV on call to the OR. Orders for this patient were amended by me following collaborative conversation with attending surgeon taking into consideration of risk versus benefits associated with the change in therapy. ? ?Honor Loh, MSN, APRN, FNP-C, CEN ?[place]  ?Peri-operative Services Nurse Practitioner ?Phone: [phone] ?08/16/21 8:35 AM ? ?

## 2021-08-17 MED ORDER — CEFAZOLIN SODIUM-DEXTROSE 2-4 GM/100ML-% IV SOLN
2.0000 g | Freq: Once | INTRAVENOUS | Status: AC
  Administered 2021-08-18: 2 g via INTRAVENOUS

## 2021-08-17 MED ORDER — LACTATED RINGERS IV SOLN
INTRAVENOUS | Status: DC
Start: 2021-08-17 — End: 2021-08-18

## 2021-08-17 MED ORDER — ORAL CARE MOUTH RINSE: 15.0000 mL | Freq: Once | OROMUCOSAL | Status: AC

## 2021-08-17 MED ORDER — FAMOTIDINE 20 MG PO TABS: 20.0000 mg | ORAL_TABLET | Freq: Once | ORAL | Status: AC

## 2021-08-17 MED ORDER — CHLORHEXIDINE GLUCONATE 0.12 % MT SOLN: 15.0000 mL | Freq: Once | OROMUCOSAL | Status: AC

## 2021-08-18 ENCOUNTER — Ambulatory Visit: Payer: Managed Care, Other (non HMO) | Admitting: Urgent Care

## 2021-08-18 ENCOUNTER — Ambulatory Visit
Admission: RE | Admit: 2021-08-18 | Discharge: 2021-08-18 | Disposition: A | Discharge status: Home / Self Care | Payer: Managed Care, Other (non HMO) | Attending: General Surgery | Admitting: General Surgery

## 2021-08-18 ENCOUNTER — Encounter: Payer: Self-pay | Admitting: General Surgery

## 2021-08-18 ENCOUNTER — Other Ambulatory Visit: Payer: Self-pay

## 2021-08-18 ENCOUNTER — Encounter
Admission: RE | Disposition: A | Discharge status: Home / Self Care | Payer: Self-pay | Source: Home / Self Care | Attending: General Surgery

## 2021-08-18 DIAGNOSIS — Z87891 Personal history of nicotine dependence: Secondary | ICD-10-CM | POA: Diagnosis not present

## 2021-08-18 DIAGNOSIS — L0591 Pilonidal cyst without abscess: Secondary | ICD-10-CM | POA: Insufficient documentation

## 2021-08-18 DIAGNOSIS — K432 Incisional hernia without obstruction or gangrene: Secondary | ICD-10-CM | POA: Diagnosis not present

## 2021-08-18 HISTORY — PX: PILONIDAL CYST EXCISION: SHX744

## 2021-08-18 SURGERY — EXCISION, SIMPLE PILONIDAL CYST
Anesthesia: General | Site: Abdomen

## 2021-08-18 MED ORDER — BUPIVACAINE-EPINEPHRINE 0.5% -1:200000 IJ SOLN
INTRAMUSCULAR | Status: DC | PRN
  Administered 2021-08-18: 17 mL

## 2021-08-18 MED ORDER — FENTANYL CITRATE (PF) 100 MCG/2ML IJ SOLN
INTRAMUSCULAR | Status: DC | PRN
  Administered 2021-08-18 (×2): 50 ug via INTRAVENOUS

## 2021-08-18 MED ORDER — OXYCODONE HCL 5 MG PO TABS
ORAL_TABLET | ORAL | Status: AC
  Filled 2021-08-18: qty 1

## 2021-08-18 MED ORDER — ACETAMINOPHEN 10 MG/ML IV SOLN
INTRAVENOUS | Status: AC
  Filled 2021-08-18: qty 100

## 2021-08-18 MED ORDER — ONDANSETRON HCL 4 MG/2ML IJ SOLN
INTRAMUSCULAR | Status: AC
Start: 2021-08-18 — End: ?
  Filled 2021-08-18: qty 2

## 2021-08-18 MED ORDER — 0.9 % SODIUM CHLORIDE (POUR BTL) OPTIME
TOPICAL | Status: DC | PRN
  Administered 2021-08-18: 500 mL

## 2021-08-18 MED ORDER — CHLORHEXIDINE GLUCONATE 0.12 % MT SOLN
OROMUCOSAL | Status: AC
  Administered 2021-08-18: 15 mL via OROMUCOSAL
  Filled 2021-08-18: qty 15

## 2021-08-18 MED ORDER — GLYCOPYRROLATE 0.2 MG/ML IJ SOLN
INTRAMUSCULAR | Status: DC | PRN
  Administered 2021-08-18: .2 mg via INTRAVENOUS

## 2021-08-18 MED ORDER — PROPOFOL 10 MG/ML IV BOLUS
INTRAVENOUS | Status: AC
  Filled 2021-08-18: qty 20

## 2021-08-18 MED ORDER — OXYCODONE HCL 5 MG PO TABS
5.0000 mg | ORAL_TABLET | Freq: Once | ORAL | Status: AC | PRN
  Administered 2021-08-18: 5 mg via ORAL

## 2021-08-18 MED ORDER — FENTANYL CITRATE (PF) 100 MCG/2ML IJ SOLN
INTRAMUSCULAR | Status: AC
  Filled 2021-08-18: qty 2

## 2021-08-18 MED ORDER — DEXAMETHASONE SODIUM PHOSPHATE 10 MG/ML IJ SOLN
INTRAMUSCULAR | Status: AC
  Filled 2021-08-18: qty 1

## 2021-08-18 MED ORDER — ACETAMINOPHEN 10 MG/ML IV SOLN
INTRAVENOUS | Status: DC | PRN
  Administered 2021-08-18: 1000 mg via INTRAVENOUS

## 2021-08-18 MED ORDER — LIDOCAINE HCL (PF) 2 % IJ SOLN
INTRAMUSCULAR | Status: AC
  Filled 2021-08-18: qty 5

## 2021-08-18 MED ORDER — OXYCODONE HCL 5 MG/5ML PO SOLN: 5.0000 mg | Freq: Once | ORAL | Status: AC | PRN

## 2021-08-18 MED ORDER — FENTANYL CITRATE (PF) 100 MCG/2ML IJ SOLN
25.0000 ug | INTRAMUSCULAR | Status: DC | PRN
  Administered 2021-08-18: 50 ug via INTRAVENOUS

## 2021-08-18 MED ORDER — PROPOFOL 10 MG/ML IV BOLUS
INTRAVENOUS | Status: DC | PRN
Start: 2021-08-18 — End: 2021-08-18
  Administered 2021-08-18: 120 mg via INTRAVENOUS
  Administered 2021-08-18: 50 mg via INTRAVENOUS

## 2021-08-18 MED ORDER — MIDAZOLAM HCL 2 MG/2ML IJ SOLN
INTRAMUSCULAR | Status: DC | PRN
  Administered 2021-08-18: 2 mg via INTRAVENOUS

## 2021-08-18 MED ORDER — CEFAZOLIN SODIUM-DEXTROSE 2-4 GM/100ML-% IV SOLN
INTRAVENOUS | Status: AC
  Filled 2021-08-18: qty 100

## 2021-08-18 MED ORDER — LIDOCAINE HCL (CARDIAC) PF 100 MG/5ML IV SOSY
PREFILLED_SYRINGE | INTRAVENOUS | Status: DC | PRN
  Administered 2021-08-18: 50 mg via INTRAVENOUS

## 2021-08-18 MED ORDER — OXYCODONE-ACETAMINOPHEN 5-325 MG PO TABS: 1.0000 | ORAL_TABLET | ORAL | 0 refills | Status: AC | PRN

## 2021-08-18 MED ORDER — BUPIVACAINE-EPINEPHRINE (PF) 0.5% -1:200000 IJ SOLN
INTRAMUSCULAR | Status: AC
  Filled 2021-08-18: qty 30

## 2021-08-18 MED ORDER — MIDAZOLAM HCL 2 MG/2ML IJ SOLN
INTRAMUSCULAR | Status: AC
  Filled 2021-08-18: qty 2

## 2021-08-18 MED ORDER — DEXMEDETOMIDINE HCL IN NACL 200 MCG/50ML IV SOLN
INTRAVENOUS | Status: DC | PRN
  Administered 2021-08-18: 4 ug via INTRAVENOUS
  Administered 2021-08-18 (×3): 8 ug via INTRAVENOUS

## 2021-08-18 MED ORDER — FAMOTIDINE 20 MG PO TABS
ORAL_TABLET | ORAL | Status: AC
  Administered 2021-08-18: 20 mg via ORAL
  Filled 2021-08-18: qty 1

## 2021-08-18 MED ORDER — DEXAMETHASONE SODIUM PHOSPHATE 10 MG/ML IJ SOLN
INTRAMUSCULAR | Status: DC | PRN
  Administered 2021-08-18: 10 mg via INTRAVENOUS

## 2021-08-18 MED ORDER — FENTANYL CITRATE (PF) 100 MCG/2ML IJ SOLN
INTRAMUSCULAR | Status: AC
  Administered 2021-08-18: 25 ug via INTRAVENOUS
  Filled 2021-08-18: qty 2

## 2021-08-18 MED ORDER — ONDANSETRON HCL 4 MG/2ML IJ SOLN
INTRAMUSCULAR | Status: DC | PRN
  Administered 2021-08-18: 4 mg via INTRAVENOUS

## 2021-08-18 MED ORDER — SEVOFLURANE IN SOLN
RESPIRATORY_TRACT | Status: AC
  Filled 2021-08-18: qty 250

## 2021-08-18 MED ORDER — GLYCOPYRROLATE 0.2 MG/ML IJ SOLN
INTRAMUSCULAR | Status: AC
  Filled 2021-08-18: qty 1

## 2021-08-18 SURGICAL SUPPLY — 32 items
ADH SKN CLS APL DERMABOND .7 (GAUZE/BANDAGES/DRESSINGS) ×1
BLADE CLIPPER SURG (BLADE) ×2 IMPLANT
BLADE SURG 15 STRL LF DISP TIS (BLADE) ×1 IMPLANT
BLADE SURG 15 STRL SS (BLADE) ×2
DERMABOND ADVANCED (GAUZE/BANDAGES/DRESSINGS) ×1
DERMABOND ADVANCED .7 DNX12 (GAUZE/BANDAGES/DRESSINGS) IMPLANT
DRAPE LAPAROTOMY 100X77 ABD (DRAPES) ×2 IMPLANT
ELECT REM PT RETURN 9FT ADLT (ELECTROSURGICAL) ×2
ELECTRODE REM PT RTRN 9FT ADLT (ELECTROSURGICAL) ×1 IMPLANT
GLOVE BIO SURGEON STRL SZ 6.5 (GLOVE) ×2 IMPLANT
GLOVE BIOGEL PI IND STRL 6.5 (GLOVE) ×1 IMPLANT
GLOVE BIOGEL PI INDICATOR 6.5 (GLOVE) ×1
GOWN STRL REUS W/ TWL LRG LVL3 (GOWN DISPOSABLE) ×2 IMPLANT
GOWN STRL REUS W/TWL LRG LVL3 (GOWN DISPOSABLE) ×4
MANIFOLD NEPTUNE II (INSTRUMENTS) ×2 IMPLANT
NDL HYPO 25X1 1.5 SAFETY (NEEDLE) ×1 IMPLANT
NEEDLE HYPO 25X1 1.5 SAFETY (NEEDLE) ×2 IMPLANT
NS IRRIG 500ML POUR BTL (IV SOLUTION) ×2 IMPLANT
PACK BASIN MINOR ARMC (MISCELLANEOUS) ×2 IMPLANT
SOL PREP PVP 2OZ (MISCELLANEOUS) ×2
SOLUTION PREP PVP 2OZ (MISCELLANEOUS) ×1 IMPLANT
SUT ETHIBOND 0 (SUTURE) ×1 IMPLANT
SUT ETHILON 2 0 FS 18 (SUTURE) ×2 IMPLANT
SUT MNCRL AB 4-0 PS2 18 (SUTURE) ×1 IMPLANT
SUT VIC AB 2-0 CT1 (SUTURE) ×2 IMPLANT
SUT VIC AB 2-0 CT1 27 (SUTURE) ×2
SUT VIC AB 2-0 CT1 TAPERPNT 27 (SUTURE) ×1 IMPLANT
SUT VIC AB 3-0 SH 27 (SUTURE) ×2
SUT VIC AB 3-0 SH 27X BRD (SUTURE) IMPLANT
SYR 10ML LL (SYRINGE) ×2 IMPLANT
SYR BULB IRRIG 60ML STRL (SYRINGE) ×2 IMPLANT
TAPE CLOTH 3X10 WHT NS LF (GAUZE/BANDAGES/DRESSINGS) ×2 IMPLANT

## 2021-08-18 NOTE — Interval H&P Note (Signed)
History and Physical Interval Note: ? ?08/18/2021 ?7:02 AM ? ?Douglas Hensley  has presented today for surgery, with the diagnosis of L05.91 Pilonidal cyst w/o abscess.  The various methods of treatment have been discussed with the patient and family. After consideration of risks, benefits and other options for treatment, the patient has consented to  Procedure(s) with comments: ?CYST EXCISION PILONIDAL SIMPLE  umbilical area (N/A) - umbilical pilonidal --- please place in supine position as a surgical intervention.  The patient's history has been reviewed, patient examined, no change in status, stable for surgery.  I have reviewed the patient's chart and labs.  Questions were answered to the patient's satisfaction.   ? ? ?[name] ? ? ?

## 2021-08-18 NOTE — Transfer of Care (Signed)
Immediate Anesthesia Transfer of Care Note ? ?Patient: Douglas Hensley ? ?Procedure(s) Performed: CYST EXCISION PILONIDAL SIMPLE  umbilical area (Abdomen) ? ?Patient Location: PACU ? ?Anesthesia Type:General ? ?Level of Consciousness: drowsy and patient cooperative ? ?Airway & Oxygen Therapy: Patient Spontanous Breathing and Patient connected to nasal cannula oxygen ? ?Post-op Assessment: Report given to RN and Post -op Vital signs reviewed and stable ? ?Post vital signs: Reviewed and stable ? ?Last Vitals:  ?Vitals Value Taken Time  ?BP 109/49 08/18/21 0833  ?Temp 36.1 ?C 08/18/21 0833  ?Pulse 63 08/18/21 0835  ?Resp 18 08/18/21 0835  ?SpO2 99 % 08/18/21 0835  ?Vitals shown include unvalidated device data. ? ?Last Pain:  ?Vitals:  ? 08/18/21 0833  ?TempSrc:   ?PainSc: Asleep  ?   ? ?  ? ?Complications: No notable events documented. ?

## 2021-08-18 NOTE — Anesthesia Postprocedure Evaluation (Signed)
Anesthesia Post Note ? ?Patient: Douglas Hensley ? ?Procedure(s) Performed: CYST EXCISION PILONIDAL SIMPLE  umbilical area (Abdomen) ? ?Patient location during evaluation: PACU ?Anesthesia Type: General ?Level of consciousness: awake and alert ?Pain management: pain level controlled ?Vital Signs Assessment: post-procedure vital signs reviewed and stable ?Respiratory status: spontaneous breathing, nonlabored ventilation, respiratory function stable and patient connected to nasal cannula oxygen ?Cardiovascular status: blood pressure returned to baseline and stable ?Postop Assessment: no apparent nausea or vomiting ?Anesthetic complications: no ? ? ?No notable events documented. ? ? ?Last Vitals:  ?Vitals:  ? 08/18/21 0915 08/18/21 0924  ?BP: 113/69 123/72  ?Pulse: (!) 55 (!) 57  ?Resp: 15 16  ?Temp:  (!) 36.3 ?C  ?SpO2: 100% 100%  ?  ?Last Pain:  ?Vitals:  ? 08/18/21 0924  ?TempSrc: Temporal  ?PainSc: 6   ? ? ?  ?  ?  ?  ?  ?  ? ?Precious Haws Javen Ridings ? ? ? ? ?

## 2021-08-18 NOTE — Op Note (Signed)
Preoperative diagnosis: Chronic pilonidal cyst ? ?Postoperative diagnosis: Chronic pilonidal cyst. ?                                            Incisional hernia  ? ?Procedure: Excision and primary closure of pilonidal cyst.  ?                    Primary repair of incisional hernia ?                    Rearrangement of tissue repair ? ?Anesthesia: General ? ?Surgeon: Dr. Elnita Maxwell, MD ? ?Indications: Patient is a 26 y.o. 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 Excision and primary closure was elected for management. ? ?Findings:  ?1. Intraoperative finding of 0.8 cm incisional hernia. Primary repaired achieved without tension ?2. No abscess and small amount of hair identified in chronic inflammatory sinus area ?3. Re arrangement of tissue repair of 16 sq cm area. ? ?Description of procedure: The patient was brought to the operating room and underwent LMA. All appropriate monitoring devices were in place. The patient was then placed in the supine jackknife position, and the buttocks were gently spread with tape. All pressure points were padded, and then the abdomen was prepped and draped in the usual sterile fashion. A time-out was completed verifying correct patient, procedure, site, positioning, and implant(s) and/or special equipment prior to beginning this procedure. ?An initial incision was done through the middle of the bellybutton.  It was carried down to the base of the bellybutton.  The chronic pilonidal sinus was identified going through the bellybutton.  At this point was decided to excise the bellybutton.  An elliptical incision was made around the umbilicus. This was deepened through subcutaneous tissue using electrocautery. The incision was continued until normal tissue deep to the next tract was encountered. The pilonidal sinus tract, granulation tissue and debri was thus  excised cleanly in its entirety.  As 0.8 incisional hernia was identified.  This was repaired with 2 figure-of-eight 0 Ethibond sutures.  Strong healthy tissue was able to be approximated without tension. ?Hemostasis was achieved with electrocautery. After ensuring that there is no infection and that the wound was clean, flaps were developed until the skin and subcutaneous tissues could be approximated in the midline without tension.  This dissection extended circumferentially for 16 cm? area.  This tissue was able to be rearranged for repair of the midline defect.  The incision was then closed in layers with interrupted sutures of 2-0 vicryl, placed in such a fashion as to completely close the dead space.  The skin was closed with Monocryl.  Dermabond was applied.   ?The patient tolerated the procedure well, was extubated and reversed from general anesthesia. Then, the patient was taken to the postanesthesia care unit in stable condition.  ? ?Wound Classification: Clean ? ?Specimen: Umbilicus ? ?Complications: None ? ?EBL: 5 ml ? ?

## 2021-08-18 NOTE — Anesthesia Procedure Notes (Signed)
Procedure Name: LMA Insertion ?Date/Time: 08/18/2021 7:37 AM ?Performed by: Omer Jack, CRNA ?Pre-anesthesia Checklist: Patient identified, Patient being monitored, Timeout performed, Emergency Drugs available and Suction available ?Patient Re-evaluated:Patient Re-evaluated prior to induction ?Oxygen Delivery Method: Circle system utilized ?Preoxygenation: Pre-oxygenation with 100% oxygen ?Induction Type: IV induction ?Ventilation: Mask ventilation without difficulty ?LMA: LMA inserted ?LMA Size: 5.0 ?Tube type: Oral ?Number of attempts: 1 ?Placement Confirmation: positive ETCO2 and breath sounds checked- equal and bilateral ?Tube secured with: Tape ?Dental Injury: Teeth and Oropharynx as per pre-operative assessment  ? ? ? ? ?

## 2021-08-18 NOTE — Anesthesia Preprocedure Evaluation (Addendum)
Anesthesia Evaluation  ?Patient identified by MRN, date of birth, ID band ?Patient awake ? ? ? ?Reviewed: ?Allergy & Precautions, NPO status , Patient's Chart, lab work & pertinent test results ? ?History of Anesthesia Complications ?Negative for: history of anesthetic complications ? ?Airway ?Mallampati: II ? ?TM Distance: >3 FB ?Neck ROM: full ? ? ? Dental ? ?(+) Chipped ?  ?Pulmonary ?sleep apnea , former smoker,  ?  ?Pulmonary exam normal ? ? ? ? ? ? ? Cardiovascular ?(-) angina(-) Past MI and (-) DOE Normal cardiovascular exam+ dysrhythmias  ? ? ?  ?Neuro/Psych ?PSYCHIATRIC DISORDERS negative neurological ROS ?   ? GI/Hepatic ?negative GI ROS, Neg liver ROS, neg GERD  ,  ?Endo/Other  ?negative endocrine ROS ? Renal/GU ?  ? ?  ?Musculoskeletal ? ? Abdominal ?  ?Peds ? Hematology ?negative hematology ROS ?(+)   ?Anesthesia Other Findings ?Past Medical History: ?No date: ADHD (attention deficit hyperactivity disorder) ?No date: OCD (obsessive compulsive disorder) ?No date: Syncope ? ?Past Surgical History: ?No date: ABDOMINAL SURGERY ?No date: APPENDECTOMY ? ?BMI   ? Body Mass Index: 22.96 kg/m?  ?  ? ? Reproductive/Obstetrics ?negative OB ROS ? ?  ? ? ? ? ? ? ? ? ? ? ? ? ? ?  ?  ? ? ? ? ? ? ? ?Anesthesia Physical ?Anesthesia Plan ? ?ASA: 3 ? ?Anesthesia Plan: General LMA  ? ?Post-op Pain Management:   ? ?Induction: Intravenous ? ?PONV Risk Score and Plan: Dexamethasone, Ondansetron, Midazolam and Treatment may vary due to age or medical condition ? ?Airway Management Planned: Oral ETT ? ?Additional Equipment:  ? ?Intra-op Plan:  ? ?Post-operative Plan: Extubation in OR ? ?Informed Consent: I have reviewed the patients History and Physical, chart, labs and discussed the procedure including the risks, benefits and alternatives for the proposed anesthesia with the patient or authorized representative who has indicated his/her understanding and acceptance.  ? ? ? ?Dental Advisory  Given ? ?Plan Discussed with: Anesthesiologist, CRNA and Surgeon ? ?Anesthesia Plan Comments: (Patient consented for risks of anesthesia including but not limited to:  ?- adverse reactions to medications ?- damage to eyes, teeth, lips or other oral mucosa ?- nerve damage due to positioning  ?- sore throat or hoarseness ?- Damage to heart, brain, nerves, lungs, other parts of body or loss of life ? ?Patient voiced understanding.)  ? ? ? ? ? ? ?Anesthesia Quick Evaluation ? ?

## 2021-08-18 NOTE — Discharge Instructions (Addendum)
?  Diet: Resume home heart healthy regular diet.  ? ?Activity: No heavy lifting >20 pounds (children, pets, laundry, garbage) or strenuous activity until follow-up, but light activity and walking are encouraged. Do not drive or drink alcohol if taking narcotic pain medications. ? ?Wound care: May shower with soapy water and pat dry (do not rub incisions), but no baths or submerging incision underwater until follow-up. (no swimming)  ? ?Medications: Resume all home medications. For mild to moderate pain: acetaminophen (Tylenol) or ibuprofen (if no kidney disease). Combining Tylenol with alcohol can substantially increase your risk of causing liver disease. Narcotic pain medications, if prescribed, can be used for severe pain, though may cause nausea, constipation, and drowsiness. If you do not need the narcotic pain medication, you do not need to fill the prescription. ? ?Call office ([phone]) at any time if any questions, worsening pain, fevers/chills, bleeding, drainage from incision site, or other concerns. ? ? ?AMBULATORY SURGERY  ?DISCHARGE INSTRUCTIONS ? ? ?The drugs that you were given will stay in your system until tomorrow so for the next 24 hours you should not: ? ?Drive an automobile ?Make any legal decisions ?Drink any alcoholic beverage ? ? ?You may resume regular meals tomorrow.  Today it is better to start with liquids and gradually work up to solid foods. ? ?You may eat anything you prefer, but it is better to start with liquids, then soup and crackers, and gradually work up to solid foods. ? ? ?Please notify your doctor immediately if you have any unusual bleeding, trouble breathing, redness and pain at the surgery site, drainage, fever, or pain not relieved by medication. ? ? ? ?Additional Instructions: ? ? ? ? ? ? ? ?Please contact your physician with any problems or Same Day Surgery at [phone], Monday through Friday 6 am to 4 pm, or [facility] number at [phone].  ?

## 2021-08-21 LAB — SURGICAL PATHOLOGY

## 2022-02-04 ENCOUNTER — Ambulatory Visit
Admission: EM | Admit: 2022-02-04 | Discharge: 2022-02-04 | Disposition: A | Discharge status: Home / Self Care | Payer: Managed Care, Other (non HMO)

## 2022-02-04 DIAGNOSIS — U071 COVID-19: Secondary | ICD-10-CM | POA: Diagnosis not present

## 2022-02-04 NOTE — Discharge Instructions (Addendum)
Follow up here or with your primary care provider if your symptoms are worsening or not improving.     

## 2022-02-04 NOTE — ED Provider Notes (Addendum)
[facility]    CSN: [id number] Arrival date & time: 02/04/22  1445      History   Chief Complaint Chief Complaint  Patient presents with   Covid Positive    HPI Douglas Hensley is a 26 y.o. male.   HPI  Patient presents to urgent care following positive test for COVID on Thursday (3 days ago) after start of symptoms Wednesday (4 days ago).  Symptoms include coughing, nasal congestion with drainage, fever, loss of smell and taste.  He endorses shortness of breath which is now resolved.  Past Medical History:  Diagnosis Date   ADHD (attention deficit hyperactivity disorder)    OCD (obsessive compulsive disorder)    Syncope     There are no problems to display for this patient.   Past Surgical History:  Procedure Laterality Date   ABDOMINAL SURGERY     APPENDECTOMY     PILONIDAL CYST EXCISION N/A 08/18/2021   Procedure: CYST EXCISION PILONIDAL SIMPLE  umbilical area;  Surgeon: Carolan Shiver, MD;  Location: [facility] ORS;  Service: General;  Laterality: N/A;  umbilical pilonidal --- please place in supine position       Home Medications    Prior to Admission medications   Medication Sig Start Date End Date Taking? Authorizing Provider  acetaminophen (TYLENOL) 500 MG tablet Take 1,000 mg by mouth every 6 (six) hours as needed for moderate pain.    [provider]  amoxicillin (AMOXIL) 875 MG tablet Take 1 tablet (875 mg total) by mouth 2 (two) times daily. Patient not taking: Reported on 08/10/2021 09/16/18   Renford Dills, NP  chlorzoxazone (PARAFON) 500 MG tablet TAKE 1 TABLET (500 MG TOTAL) BY MOUTH 4 TIMES DAILY AS NEEDED FOR MUSCLE SPASMS    [provider]  clotrimazole-betamethasone (LOTRISONE) cream Apply topically 2 (two) times daily. 11/08/21   [provider]  meloxicam (MOBIC) 15 MG tablet Take 1 tablet by mouth daily.    [provider]  mupirocin ointment (BACTROBAN) 2 % Apply topically 3 (three) times  daily. 11/21/21   [provider]  oxyCODONE-acetaminophen (PERCOCET) 5-325 MG tablet Take 1 tablet by mouth every 4 (four) hours as needed for severe pain. 08/18/21 08/18/22  Carolan Shiver, MD  traZODone (DESYREL) 50 MG tablet Take 50 mg by mouth at bedtime as needed for sleep. 07/27/21   [provider]    Family History History reviewed. No pertinent family history.  Social History Social History   Tobacco Use   Smoking status: Former    Packs/day: 0.50    Types: Cigarettes    Quit date: 2021    Years since quitting: 2.7   Smokeless tobacco: Never  Vaping Use   Vaping Use: Never used  Substance Use Topics   Alcohol use: Yes    Comment: occ   Drug use: No     Allergies   Depakote [valproic acid] and Keflex [cephalexin]   Review of Systems Review of Systems   Physical Exam Triage Vital Signs ED Triage Vitals  Enc Vitals Group     BP 02/04/22 1514 123/82     Pulse Rate 02/04/22 1514 82     Resp 02/04/22 1514 18     Temp 02/04/22 1514 99.2 F (37.3 C)     Temp Source 02/04/22 1514 Oral     SpO2 02/04/22 1514 95 %     Weight --      Height --      Head Circumference --  Peak Flow --      Pain Score 02/04/22 1510 0     Pain Loc --      Pain Edu? --      Excl. in [place]? --    No data found.  Updated Vital Signs BP 123/82 (BP Location: Left Arm)   Pulse 82   Temp 99.2 F (37.3 C) (Oral)   Resp 18   SpO2 95%   Visual Acuity Right Eye Distance:   Left Eye Distance:   Bilateral Distance:    Right Eye Near:   Left Eye Near:    Bilateral Near:     Physical Exam Vitals reviewed.  Constitutional:      Appearance: Normal appearance.  HENT:     Nose: Congestion present.     Mouth/Throat:     Pharynx: Posterior oropharyngeal erythema present. No oropharyngeal exudate.  Eyes:     Conjunctiva/sclera: Conjunctivae normal.     Pupils: Pupils are equal, round, and reactive to light.  Cardiovascular:     Rate and Rhythm: Normal  rate and regular rhythm.     Pulses: Normal pulses.     Heart sounds: Normal heart sounds.  Pulmonary:     Effort: Pulmonary effort is normal.     Breath sounds: Normal breath sounds.  Skin:    General: Skin is warm and dry.  Neurological:     General: No focal deficit present.     Mental Status: He is alert and oriented to person, place, and time.  Psychiatric:        Mood and Affect: Mood normal.        Behavior: Behavior normal.      UC Treatments / Results  Labs (all labs ordered are listed, but only abnormal results are displayed) Labs Reviewed - No data to display  EKG   Radiology No results found.  Procedures Procedures (including critical care time)  Medications Ordered in UC Medications - No data to display  Initial Impression / Assessment and Plan / UC Course  I have reviewed the triage vital signs and the nursing notes.  Pertinent labs & imaging results that were available during my care of the patient were reviewed by me and considered in my medical decision making (see chart for details).  Physical exam is reassuring.  Lungs CTA B.  Some nasal congestion is present as well as erythematous pharynx.  Recommend using OTC medication for symptom control.  Patient is young and in generally good health and would not be a candidate for antiviral treatment.  Will provide work note for documentation of illness.  Final Clinical Impressions(s) / UC Diagnoses   Final diagnoses:  None   Discharge Instructions   None    ED Prescriptions   None    PDMP not reviewed this encounter.   Rose Phi, [place] 02/04/22 1537    ImmordinoAnnie Main, [place] 02/04/22 1540

## 2022-02-04 NOTE — ED Triage Notes (Signed)
Pt. Presents to UC c/o coughing and nasal congestion/ drainage since Wednesday. Pt. States Thursday he woke up w/ a fever of 101 F, chills and unable to smell and taste. Pt. Tested positive for Covid on Thursday through an at home test.

## 2023-09-13 ENCOUNTER — Other Ambulatory Visit: Payer: Self-pay | Admitting: Family Medicine

## 2023-09-13 DIAGNOSIS — M25511 Pain in right shoulder: Secondary | ICD-10-CM

## 2023-09-24 ENCOUNTER — Ambulatory Visit
Admission: RE | Admit: 2023-09-24 | Discharge: 2023-09-24 | Disposition: A | Discharge status: Home / Self Care | Source: Ambulatory Visit | Attending: Family Medicine | Admitting: Family Medicine

## 2023-09-24 ENCOUNTER — Other Ambulatory Visit: Payer: Self-pay | Admitting: Family Medicine

## 2023-09-24 DIAGNOSIS — M25511 Pain in right shoulder: Secondary | ICD-10-CM | POA: Diagnosis present

## 2023-09-24 MED ORDER — IOHEXOL 180 MG/ML  SOLN
20.0000 mL | Freq: Once | INTRAMUSCULAR | Status: AC | PRN
  Administered 2023-09-24: 15 mL

## 2023-09-24 MED ORDER — SODIUM CHLORIDE (PF) 0.9% IJ SOLUTION - NO CHARGE
20.0000 mL | INTRAMUSCULAR | Status: DC | PRN
  Administered 2023-09-24: 5 mL via INTRAVENOUS
  Filled 2023-09-24: qty 20

## 2023-09-24 MED ORDER — GADOBUTROL 1 MMOL/ML IV SOLN
2.0000 mL | Freq: Once | INTRAVENOUS | Status: AC | PRN
  Administered 2023-09-24: 0.05 mL

## 2023-09-24 MED ORDER — LIDOCAINE HCL (PF) 1 % IJ SOLN
10.0000 mL | Freq: Once | INTRAMUSCULAR | Status: AC
  Administered 2023-09-24: 10 mL
  Filled 2023-09-24: qty 10
